# Patient Record
Sex: Female | Born: 1965 | Race: White | Hispanic: No | Smoking: Current every day smoker
Health system: Southern US, Community
[De-identification: ages and names within clinical notes are randomized; demographics above are authoritative.]

## PROBLEM LIST (undated history)

## (undated) DIAGNOSIS — I10 Essential (primary) hypertension: Secondary | ICD-10-CM

## (undated) DIAGNOSIS — E079 Disorder of thyroid, unspecified: Secondary | ICD-10-CM

---

## 2018-09-01 ENCOUNTER — Encounter: Payer: Self-pay | Admitting: Emergency Medicine

## 2018-09-01 ENCOUNTER — Emergency Department
Admission: EM | Admit: 2018-09-01 | Discharge: 2018-09-01 | Disposition: A | Discharge status: Home / Self Care | Payer: BLUE CROSS/BLUE SHIELD | Attending: Emergency Medicine | Admitting: Emergency Medicine

## 2018-09-01 DIAGNOSIS — R440 Auditory hallucinations: Secondary | ICD-10-CM

## 2018-09-01 DIAGNOSIS — I10 Essential (primary) hypertension: Secondary | ICD-10-CM | POA: Insufficient documentation

## 2018-09-01 DIAGNOSIS — F17210 Nicotine dependence, cigarettes, uncomplicated: Secondary | ICD-10-CM | POA: Diagnosis not present

## 2018-09-01 DIAGNOSIS — E079 Disorder of thyroid, unspecified: Secondary | ICD-10-CM | POA: Diagnosis not present

## 2018-09-01 DIAGNOSIS — F29 Unspecified psychosis not due to a substance or known physiological condition: Secondary | ICD-10-CM

## 2018-09-01 HISTORY — DX: Essential (primary) hypertension: I10

## 2018-09-01 HISTORY — DX: Disorder of thyroid, unspecified: E07.9

## 2018-09-01 LAB — COMPREHENSIVE METABOLIC PANEL
ALT: 34 U/L (ref 0–44)
AST: 36 U/L (ref 15–41)
Albumin: 4.2 g/dL (ref 3.5–5.0)
Alkaline Phosphatase: 121 U/L (ref 38–126)
Anion gap: 9 (ref 5–15)
BUN: 9 mg/dL (ref 6–20)
CO2: 27 mmol/L (ref 22–32)
CREATININE: 0.67 mg/dL (ref 0.44–1.00)
Calcium: 9.5 mg/dL (ref 8.9–10.3)
Chloride: 102 mmol/L (ref 98–111)
GFR calc Af Amer: >60 mL/min (ref >60)
GFR calc non Af Amer: >60 mL/min (ref >60)
Glucose, Bld: 93 mg/dL (ref 70–99)
Potassium: 3.7 mmol/L (ref 3.5–5.1)
Sodium: 138 mmol/L (ref 135–145)
Total Bilirubin: 0.6 mg/dL (ref 0.3–1.2)
Total Protein: 8.4 g/dL — ABNORMAL HIGH (ref 6.5–8.1)

## 2018-09-01 LAB — URINALYSIS, COMPLETE (UACMP) WITH MICROSCOPIC
Bilirubin Urine: NEGATIVE
Glucose, UA: NEGATIVE mg/dL
Ketones, ur: NEGATIVE mg/dL
Nitrite: NEGATIVE
Protein, ur: NEGATIVE mg/dL
SPECIFIC GRAVITY, URINE: 1.005 (ref 1.005–1.030)
pH: 8 (ref 5.0–8.0)

## 2018-09-01 LAB — URINE DRUG SCREEN, QUALITATIVE (ARMC ONLY)
Amphetamines, Ur Screen: NOT DETECTED
Barbiturates, Ur Screen: NOT DETECTED
Benzodiazepine, Ur Scrn: NOT DETECTED
COCAINE METABOLITE, UR ~~LOC~~: NOT DETECTED
Cannabinoid 50 Ng, Ur ~~LOC~~: NOT DETECTED
MDMA (Ecstasy)Ur Screen: NOT DETECTED
Methadone Scn, Ur: NOT DETECTED
Opiate, Ur Screen: NOT DETECTED
Phencyclidine (PCP) Ur S: NOT DETECTED
TRICYCLIC, UR SCREEN: NOT DETECTED

## 2018-09-01 LAB — CBC WITH DIFFERENTIAL/PLATELET
Abs Immature Granulocytes: 0.01 10*3/uL (ref 0.00–0.07)
BASOS PCT: 1 %
Basophils Absolute: 0.1 10*3/uL (ref 0.0–0.1)
Eosinophils Absolute: 0.1 10*3/uL (ref 0.0–0.5)
Eosinophils Relative: 1 %
HCT: 45.6 % (ref 36.0–46.0)
Hemoglobin: 15.2 g/dL — ABNORMAL HIGH (ref 12.0–15.0)
Immature Granulocytes: 0 %
Lymphocytes Relative: 27 %
Lymphs Abs: 2.1 10*3/uL (ref 0.7–4.0)
MCH: 31.1 pg (ref 26.0–34.0)
MCHC: 33.3 g/dL (ref 30.0–36.0)
MCV: 93.4 fL (ref 80.0–100.0)
Monocytes Absolute: 0.5 10*3/uL (ref 0.1–1.0)
Monocytes Relative: 6 %
Neutro Abs: 5.2 10*3/uL (ref 1.7–7.7)
Neutrophils Relative %: 65 %
PLATELETS: 387 10*3/uL (ref 150–400)
RBC: 4.88 MIL/uL (ref 3.87–5.11)
RDW: 11.8 % (ref 11.5–15.5)
WBC: 8 10*3/uL (ref 4.0–10.5)
nRBC: 0 % (ref 0.0–0.2)

## 2018-09-01 LAB — TSH: TSH: 4.87 u[IU]/mL — ABNORMAL HIGH (ref 0.350–4.500)

## 2018-09-01 LAB — ETHANOL: Alcohol, Ethyl (B): <10 mg/dL (ref <10)

## 2018-09-01 LAB — ACETAMINOPHEN LEVEL: Acetaminophen (Tylenol), Serum: <10 ug/mL — ABNORMAL LOW (ref 10–30)

## 2018-09-01 LAB — SALICYLATE LEVEL

## 2018-09-01 MED ORDER — OLANZAPINE 10 MG PO TABS: 10.0000 mg | ORAL_TABLET | Freq: Every day | ORAL | 1 refills | Status: AC

## 2018-09-01 NOTE — ED Provider Notes (Signed)
-----------------------------------------   3:51 PM on 09/01/2018 -----------------------------------------  I took over care of this patient from Dr. Alphonzo Lemmings.  The patient has been evaluated by Dr. Toni Amend.  He advises that the patient currently does not meet commitment criteria, but he agrees that she is having auditory hallucinations and symptoms of possible psychosis.  He recommends starting the patient on Zyprexa and has given her a prescription for this.  She states that this time that she agrees to take it.  The patient states that she is able to follow-up with a mental health professional through her work and agreed to do this.  At this time the patient is stable for discharge home.  I counseled her on the results of the work-up and gave her thorough return precautions.  She expressed understanding.   Dionne Bucy, MD 09/01/18 1555

## 2018-09-01 NOTE — ED Notes (Signed)
Denies SI/HI

## 2018-09-01 NOTE — ED Notes (Signed)
ED Provider at bedside. 

## 2018-09-01 NOTE — ED Notes (Signed)
Dr. Clapacs at bedside.  Maintained on 15 minute checks and observation by security camera for safety. 

## 2018-09-01 NOTE — ED Provider Notes (Addendum)
[facility] Emergency Department Provider Note  ____________________________________________   I have reviewed the triage vital signs and the nursing notes. Where available I have reviewed prior notes and, if possible and indicated, outside hospital notes.    HISTORY  Chief Complaint Hearing Problem    HPI Nicole Berry is a 53 y.o. female who has a history of thyroidism hypertension, states that she has been hearing voices since October 21.  The voices are speaking in [place].  They are not command hallucinations.  She states that it is annoying to hear them talking all the time.  She does not just hear them in 1 year she hears them in both.  She states that she has heard about how people can transmit voices using hearing aids and she is certain that no one is implanted anything in her however she wants someone to look in her ears and make sure there is no other pathology present.  Patient states she has not been sleeping very well and she feels very stressed because of these voices.  She states she did hear voices once before when she was younger but does not seem to want to give any details about that.  She denies any SI or HI, she states she is not being abused but she is going through a separation which is causing her a great deal of stress.  She states she is sleeping poorly but she is sleeping.  She does not feel this is a psychiatric problem she feels that these voices are real and that no one else can hear them.  Sometimes is the voice of people she knows who will do not happen to be present.  Any other complaint, she states she does not drink or do drugs, he does make it to work every day and she has been waiting till now to get help because she has not had insurance benefits with her job.  Nothing makes the symptoms better, nothing makes it better no other alleviating or aggravating factors, no prior treatment.  It is concerning to her.   Past Medical History:   Diagnosis Date  . Hypertension   . Thyroid disease     There are no active problems to display for this patient.   History reviewed. No pertinent surgical history.  Prior to Admission medications   Not on File    Allergies Patient has no known allergies.  No family history on file.  Social History Social History   Tobacco Use  . Smoking status: Current Every Day Smoker    Packs/day: 2.00    Types: Cigarettes  . Smokeless tobacco: Never Used  Substance Use Topics  . Alcohol use: Not Currently  . Drug use: Not on file    Review of Systems constitutional: No fever/chills Eyes: No visual changes. ENT: No sore throat. No stiff neck no neck pain Cardiovascular: Denies chest pain. Respiratory: Denies shortness of breath. Gastrointestinal:   no vomiting.  No diarrhea.  No constipation. Genitourinary: Negative for dysuria. Musculoskeletal: Negative lower extremity swelling Skin: Negative for rash. Neurological: Negative for severe headaches, focal weakness or numbness.   ____________________________________________   PHYSICAL EXAM:  VITAL SIGNS: ED Triage Vitals  Enc Vitals Group     BP 09/01/18 1341 (!) 158/90     Pulse Rate 09/01/18 1341 91     Resp 09/01/18 1341 18     Temp 09/01/18 1341 97.7 F (36.5 C)     Temp Source 09/01/18 1341 Oral  SpO2 09/01/18 1341 100 %     Weight 09/01/18 1342 140 lb (63.5 kg)     Height 09/01/18 1342 5\' 8"  (1.727 m)     Head Circumference --      Peak Flow --      Pain Score 09/01/18 1341 0     Pain Loc --      Pain Edu? --      Excl. in GC? --     Constitutional: Alert and oriented. Well appearing and in no acute distress.  Patient seems very stressed and anxious but in no acute medical distress Eyes: Conjunctivae are normal Head: Atraumatic HEENT: No congestion/rhinnorhea. Mucous membranes are moist.  Oropharynx non-erythematous TMs are normal Neck:   Nontender with no meningismus, no masses, no  stridor Cardiovascular: Normal rate, regular rhythm. Grossly normal heart sounds.  Good peripheral circulation. Respiratory: Normal respiratory effort.  No retractions. Lungs CTAB. Abdominal: Soft and nontender. No distention. No guarding no rebound Back:  There is no focal tenderness or step off.  there is no midline tenderness there are no lesions noted. there is no CVA tenderness Musculoskeletal: No lower extremity tenderness, no upper extremity tenderness. No joint effusions, no DVT signs strong distal pulses no edema Neurologic:  Normal speech and language. No gross focal neurologic deficits are appreciated.  Skin:  Skin is warm, dry and intact. No rash noted. Psychiatric: Mood and affect are stressed, and again she is talking about hearing voices but otherwise does not seem to be psychotic.  ____________________________________________   LABS (all labs ordered are listed, but only abnormal results are displayed)  Labs Reviewed  CBC WITH DIFFERENTIAL/PLATELET - Abnormal; Notable for the following components:      Result Value   Hemoglobin 15.2 (*)    All other components within normal limits  COMPREHENSIVE METABOLIC PANEL  URINALYSIS, COMPLETE (UACMP) WITH MICROSCOPIC  URINE DRUG SCREEN, QUALITATIVE ([facility] ONLY)  ACETAMINOPHEN LEVEL  SALICYLATE LEVEL  ETHANOL    Pertinent labs  results that were available during my care of the patient were reviewed by me and considered in my medical decision making (see chart for details). ____________________________________________  EKG  I personally interpreted any EKGs ordered by me or triage  ____________________________________________  RADIOLOGY  Pertinent labs & imaging results that were available during my care of the patient were reviewed by me and considered in my medical decision making (see chart for details). If possible, patient and/or family made aware of any abnormal findings.  No results  found. ____________________________________________    PROCEDURES  Procedure(s) performed: None  Procedures  Critical Care performed: None  ____________________________________________   INITIAL IMPRESSION / ASSESSMENT AND PLAN / ED COURSE  Pertinent labs & imaging results that were available during my care of the patient were reviewed by me and considered in my medical decision making (see chart for details).  Patient here very eager to have me look in her ears to see where the voices are coming from.  I did look at her ears, there does not appear to be any evidence of external auditory pathology.  Patient does seem to be having a delusion of voices which is been present now for several months, 2-1/2 by her reckoning.  She very much does not want to be committed but she does agree to talk to a psychiatrist about this after I talked to her.  She does not have any SI or HI and seems to be functioning otherwise, I do not think this is sufficient  on its own to mandate IVC, and thus psychiatry disagrees.  For this reason I did talk to Dr. Toni Amend, who will come evaluate the patient very much appreciate the consult.  We are also checking basic blood work, patient agrees with this.  ----------------------------------------- 3:17 PM on 09/01/2018 ----------------------------------------- Signed out to Dr. Marisa Severin at the end of my shift.  Pending psychiatric consultation and blood work      ____________________________________________   FINAL CLINICAL IMPRESSION(S) / ED DIAGNOSES  Final diagnoses:  None      This chart was dictated using voice recognition software.  Despite best efforts to proofread,  errors can occur which can change meaning.     Jeanmarie Plant, MD 09/01/18 1440    Jeanmarie Plant, MD 09/01/18 [phone]

## 2018-09-01 NOTE — Consult Note (Signed)
[facility] Face-to-Face Psychiatry Consult   Reason for Consult: Consult for this 53 year old woman who came to the emergency room with complaints of auditory hallucinations Referring Physician: Alphonzo Lemmings Patient Identification: Nicole Berry MRN:  [id number] Principal Diagnosis: Psychosis (HCC) Diagnosis:  Principal Problem:   Psychosis (HCC)   Total Time spent with patient: 1 hour  Subjective:   Nicole Berry is a 53 y.o. female patient admitted with "I have been having this thing in my ear".  HPI: Patient seen chart reviewed.  Case discussed with emergency room doctor.  This is a 53 year old woman previously unknown to [place] who came to the emergency room today complaining that she can hear sounds and voices in her ears.  This has been going on since October but her insurance only became effective today which is why she came to the emergency room.  She says that it will sometimes sound like a buzzing but frequently sounds like women's voices.  They will say things to her such as "they do not want to hear".  Denies that they are making any commands or telling her to do anything dangerous.  She says this happens almost all day and all night and is keeping her up at night.  She has been sleeping poorly sometimes going 2 days without sleep.  Mood has also been more irritable and grumpy.  She lives with a man with whom she has been together for about 7 years and they are currently in the process of breaking up.  It was impossible to tell from talking to her whether this was caused by her mental state or was a cause of her mental state or how it was related.  Clearly though she is under some stress about it.  She mentions feeling like there are other stresses but will not discuss what they are.  She denies any drug or alcohol use.  Patient denies any suicidal thoughts or wish to die.  Denies any homicidal or violent thoughts.  Patient insisted to me that she did not think that what she is having could be a psychiatric  symptom.  She was somewhat difficult to interview frequently saying she thought she would get up and leave but ultimately did not stay long enough for a useful conversation.  Social history: Lives in [place].  Has been together with her partner for about 7 years.  She says she is getting ready to leave because they are "no longer compatible".  Do not know any other details about it.  She works at the [employer] hours.  Says that she likes her job.  Medical history: History of hypertension and thyroid disease denies any other significant ongoing medical problems  Substance abuse history: Denies any drug or alcohol abuse  Past Psychiatric History: Difficult to get information.  Patient told me that she had never seen a psychiatrist in the past "except against my will".  She ultimately admitted that she had had psychiatric hospitalizations in the past at least once in [place].  She would not give me any details about it.  She did appear to have some familiarity with psychiatric jargon which was on display during the interview.  Did not appear to be familiar with any medications that I mentioned but did seem to be familiar with the idea of psychiatric medicine.  Told me that she did not understand how she could go so long in between episodes if this was a psychiatric condition.  Still would not give me any details about  the prior "episode".  Risk to Self:   Risk to Others:   Prior Inpatient Therapy:   Prior Outpatient Therapy:    Past Medical History:  Past Medical History:  Diagnosis Date  . Hypertension   . Thyroid disease    History reviewed. No pertinent surgical history. Family History: No family history on file. Family Psychiatric  History: Does not know of any Social History:  Social History   Substance and Sexual Activity  Alcohol Use Not Currently     Social History   Substance and Sexual Activity  Drug Use Not on file    Social History   Socioeconomic History   . Marital status: Legally Separated    Spouse name: Not on file  . Number of children: Not on file  . Years of education: Not on file  . Highest education level: Not on file  Occupational History  . Not on file  Social Needs  . Financial resource strain: Not on file  . Food insecurity:    Worry: Not on file    Inability: Not on file  . Transportation needs:    Medical: Not on file    Non-medical: Not on file  Tobacco Use  . Smoking status: Current Every Day Smoker    Packs/day: 2.00    Types: Cigarettes  . Smokeless tobacco: Never Used  Substance and Sexual Activity  . Alcohol use: Not Currently  . Drug use: Not on file  . Sexual activity: Not on file  Lifestyle  . Physical activity:    Days per week: Not on file    Minutes per session: Not on file  . Stress: Not on file  Relationships  . Social connections:    Talks on phone: Not on file    Gets together: Not on file    Attends religious service: Not on file    Active member of club or organization: Not on file    Attends meetings of clubs or organizations: Not on file    Relationship status: Not on file  Other Topics Concern  . Not on file  Social History Narrative  . Not on file   Additional Social History:    Allergies:  No Known Allergies  Labs:  Results for orders placed or performed during the hospital encounter of 09/01/18 (from the past 48 hour(s))  CBC with Differential     Status: Abnormal   Collection Time: 09/01/18  2:18 PM  Result Value Ref Range   WBC 8.0 4.0 - 10.5 K/uL   RBC 4.88 3.87 - 5.11 MIL/uL   Hemoglobin 15.2 (H) 12.0 - 15.0 g/dL   HCT 57.845.6 46.936.0 - 62.946.0 %   MCV 93.4 80.0 - 100.0 fL   MCH 31.1 26.0 - 34.0 pg   MCHC 33.3 30.0 - 36.0 g/dL   RDW 52.811.8 41.311.5 - 24.415.5 %   Platelets 387 150 - 400 K/uL   nRBC 0.0 0.0 - 0.2 %   Neutrophils Relative % 65 %   Neutro Abs 5.2 1.7 - 7.7 K/uL   Lymphocytes Relative 27 %   Lymphs Abs 2.1 0.7 - 4.0 K/uL   Monocytes Relative 6 %   Monocytes  Absolute 0.5 0.1 - 1.0 K/uL   Eosinophils Relative 1 %   Eosinophils Absolute 0.1 0.0 - 0.5 K/uL   Basophils Relative 1 %   Basophils Absolute 0.1 0.0 - 0.1 K/uL   Immature Granulocytes 0 %   Abs Immature Granulocytes 0.01 0.00 - 0.07 K/uL  Comment: Performed at [facility], [address] [place], [place] [address]  Comprehensive metabolic panel     Status: Abnormal   Collection Time: 09/01/18  2:18 PM  Result Value Ref Range   Sodium 138 135 - 145 mmol/L   Potassium 3.7 3.5 - 5.1 mmol/L   Chloride 102 98 - 111 mmol/L   CO2 27 22 - 32 mmol/L   Glucose, Bld 93 70 - 99 mg/dL   BUN 9 6 - 20 mg/dL   Creatinine, Ser 0.71 0.44 - 1.00 mg/dL   Calcium 9.5 8.9 - 21.9 mg/dL   Total Protein 8.4 (H) 6.5 - 8.1 g/dL   Albumin 4.2 3.5 - 5.0 g/dL   AST 36 15 - 41 U/L   ALT 34 0 - 44 U/L   Alkaline Phosphatase 121 38 - 126 U/L   Total Bilirubin 0.6 0.3 - 1.2 mg/dL   GFR calc non Af Amer >60 >60 mL/min   GFR calc Af Amer >60 >60 mL/min   Anion gap 9 5 - 15    Comment: Performed at [facility], [address], [place], [place] [address]  Urinalysis, Complete w Microscopic     Status: Abnormal   Collection Time: 09/01/18  2:18 PM  Result Value Ref Range   Color, Urine YELLOW (A) YELLOW   APPearance CLEAR (A) CLEAR   Specific Gravity, Urine 1.005 1.005 - 1.030   pH 8.0 5.0 - 8.0   Glucose, UA NEGATIVE NEGATIVE mg/dL   Hgb urine dipstick SMALL (A) NEGATIVE   Bilirubin Urine NEGATIVE NEGATIVE   Ketones, ur NEGATIVE NEGATIVE mg/dL   Protein, ur NEGATIVE NEGATIVE mg/dL   Nitrite NEGATIVE NEGATIVE   Leukocytes, UA SMALL (A) NEGATIVE   RBC / HPF 0-5 0 - 5 RBC/hpf   WBC, UA 0-5 0 - 5 WBC/hpf   Bacteria, UA RARE (A) NONE SEEN   Squamous Epithelial / LPF 0-5 0 - 5    Comment: Performed at [facility], [address] [place] [address], [place], [place] [address]  Urine Drug Screen, Qualitative     Status: None   Collection Time: 09/01/18  2:18 PM  Result Value Ref Range    Tricyclic, Ur Screen NONE DETECTED NONE DETECTED   Amphetamines, Ur Screen NONE DETECTED NONE DETECTED   MDMA (Ecstasy)Ur Screen NONE DETECTED NONE DETECTED   Cocaine Metabolite,Ur [place] NONE DETECTED NONE DETECTED   Opiate, Ur Screen NONE DETECTED NONE DETECTED   Phencyclidine (PCP) Ur S NONE DETECTED NONE DETECTED   Cannabinoid 50 Ng, Ur [place] NONE DETECTED NONE DETECTED   Barbiturates, Ur Screen NONE DETECTED NONE DETECTED   Benzodiazepine, Ur Scrn NONE DETECTED NONE DETECTED   Methadone Scn, Ur NONE DETECTED NONE DETECTED    Comment: (NOTE) Tricyclics + metabolites, urine    Cutoff 1000 ng/mL Amphetamines + metabolites, urine  Cutoff 1000 ng/mL MDMA (Ecstasy), urine              Cutoff 500 ng/mL Cocaine Metabolite, urine          Cutoff 300 ng/mL Opiate + metabolites, urine        Cutoff 300 ng/mL Phencyclidine (PCP), urine         Cutoff 25 ng/mL Cannabinoid, urine                 Cutoff 50 ng/mL Barbiturates + metabolites, urine  Cutoff 200 ng/mL Benzodiazepine, urine              Cutoff 200 ng/mL  Methadone, urine                   Cutoff 300 ng/mL The urine drug screen provides only a preliminary, unconfirmed analytical test result and should not be used for non-medical purposes. Clinical consideration and professional judgment should be applied to any positive drug screen result due to possible interfering substances. A more specific alternate chemical method must be used in order to obtain a confirmed analytical result. Gas chromatography / mass spectrometry (GC/MS) is the preferred confirmat ory method. Performed at [facility], [address] [place], [place] [address]   Acetaminophen level     Status: Abnormal   Collection Time: 09/01/18  2:18 PM  Result Value Ref Range   Acetaminophen (Tylenol), Serum <10 (L) 10 - 30 ug/mL    Comment: (NOTE) Therapeutic concentrations vary significantly. A range of 10-30 ug/mL  may be an effective concentration for many  patients. However, some  are best treated at concentrations outside of this range. Acetaminophen concentrations >150 ug/mL at 4 hours after ingestion  and >50 ug/mL at 12 hours after ingestion are often associated with  toxic reactions. Performed at [facility], [address] [place], [place] [address]   Salicylate level     Status: None   Collection Time: 09/01/18  2:18 PM  Result Value Ref Range   Salicylate Lvl <7.0 2.8 - 30.0 mg/dL    Comment: Performed at [facility], [address] [place], [place] [address]  Ethanol     Status: None   Collection Time: 09/01/18  2:18 PM  Result Value Ref Range   Alcohol, Ethyl (B) <10 <10 mg/dL    Comment: (NOTE) Lowest detectable limit for serum alcohol is 10 mg/dL. For medical purposes only. Performed at [facility], [address] [place], [place] [address]   TSH     Status: Abnormal   Collection Time: 09/01/18  2:18 PM  Result Value Ref Range   TSH 4.870 (H) 0.350 - 4.500 uIU/mL    Comment: Performed by a 3rd Generation assay with a functional sensitivity of <=0.01 uIU/mL. Performed at [facility], [address] [place], [place] [address]     No current facility-administered medications for this encounter.    Current Outpatient Medications  Medication Sig Dispense Refill  . OLANZapine (ZYPREXA) 10 MG tablet Take 1 tablet (10 mg total) by mouth at bedtime. 30 tablet 1    Musculoskeletal: Strength & Muscle Tone: within normal limits Gait & Station: normal Patient leans: N/A  Psychiatric Specialty Exam: Physical Exam  Nursing note and vitals reviewed. Constitutional: She appears well-developed and well-nourished.  HENT:  Head: Normocephalic and atraumatic.  Eyes: Pupils are equal, round, and reactive to light. Conjunctivae are normal.  Neck: Normal range of motion.  Cardiovascular: Regular rhythm and normal heart sounds.  Respiratory: Effort normal.  GI: Soft.   Musculoskeletal: Normal range of motion.  Neurological: She is alert.  Skin: Skin is warm and dry.  Psychiatric: Her mood appears anxious. Her affect is labile and inappropriate. Her speech is tangential. She is agitated. She is not aggressive and not combative. Thought content is paranoid. Cognition and memory are normal. She expresses inappropriate judgment. She expresses no homicidal and no suicidal ideation.    Review of Systems  Constitutional: Negative.   HENT: Negative.   Eyes: Negative.   Respiratory: Negative.   Cardiovascular: Negative.   Gastrointestinal: Negative.   Musculoskeletal: Negative.   Skin: Negative.  Neurological: Negative.   Psychiatric/Behavioral: Positive for hallucinations. Negative for depression, memory loss, substance abuse and suicidal ideas. The patient is nervous/anxious and has insomnia.     Blood pressure (!) 152/102, pulse 95, temperature (!) 97.4 F (36.3 C), temperature source Oral, resp. rate 18, height 5\' 8"  (1.727 m), weight 63.5 kg, SpO2 100 %.Body mass index is 21.29 kg/m.  General Appearance: Casual  Eye Contact:  Minimal  Speech:  Clear and Coherent  Volume:  Normal  Mood:  Dysphoric  Affect:  Constricted  Thought Process:  Coherent  Orientation:  Full (Time, Place, and Person)  Thought Content:  Lucid but a little paranoid and rather evasive.  Suicidal Thoughts:  No  Homicidal Thoughts:  No  Memory:  Immediate;   Fair Recent;   Fair Remote;   Fair  Judgement:  Impaired  Insight:  Shallow  Psychomotor Activity:  Normal  Concentration:  Concentration: Fair  Recall:  [employer]  Fund of Knowledge:  Fair  Language:  Fair  Akathisia:  No  Handed:  Right  AIMS (if indicated):     Assets:  Housing Physical Health  ADL's:  Intact  Cognition:  WNL  Sleep:        Treatment Plan Summary: Medication management and Plan This is a patient who presents with auditory hallucinations irritability sleeplessness.  Based on the interview it  seems clear that she has had psychiatric treatment in the past.  Patient is unwilling to acknowledge directly that this could be a mental health problem.  There is no evidence that she is suicidal or violent dangerous or failing to take care of herself.  She does not meet commitment criteria.  I spent quite a bit of time trying to form some rapport and expressed some empathy for the patient in order to convince her that she should consider that this may be a mental health problem.  I told her that my guess is that she probably has a type of bipolar illness that can be quiesced sent for years and then resulted in psychotic breakdowns under stress.  I strongly advised patient to let us start medication.  I pointed out to her that these kind of things might possibly get better on their own but are at least as likely to get worse before they get better.  Patient ultimately agreed to take a prescription for olanzapine 10 mg at night.  Side effects reviewed.  Strongly encouraged her to use her new insurance to get in to see a mental health provider which she can apparently do through her work.  Advised her to come back to the emergency room anytime she was concerned about her symptoms particularly if things got worse.  Case reviewed with emergency room doctor.  Disposition: No evidence of imminent risk to self or others at present.   Patient does not meet criteria for psychiatric inpatient admission. Supportive therapy provided about ongoing stressors. Discussed crisis plan, support from social network, calling 911, coming to the Emergency Department, and calling Suicide Hotline.  Mordecai RasmussenJohn Jermain Curt, MD 09/01/2018 4:33 PM

## 2018-09-01 NOTE — ED Triage Notes (Addendum)
Patient presents to the ED for "hearing things" since October.  Patient states, "I think something is wrong with my ears."  Patient states, "I know I'm not crazy because I'm functional.  I wake up in the morning and drive my car and go to work no problem."  Patient reports difficulty sleeping.  Patient states, "it's like I'm hearing a radio, but if I say something, it talks back and sometimes it changes frequencies."  Patient denies any commands from voices.  Patient states she does not want to see a psychiatrist.

## 2018-09-01 NOTE — Discharge Instructions (Addendum)
Follow-up with a mental health provider through your work as discussed with the psychiatrist Dr. Toni Amend.  We have also provided you with a number for 1 of the primary care providers in our area for you to make an appointment with her primary care/family doctor.  Start taking the medication as prescribed.  Return to the ER immediately at any time if your symptoms worsen such as if you start hearing voices more frequently, you have other intrusive thoughts, or hearing or seeing things that are not there, have worsening anxiety, or any thoughts of wanting to hurt yourself or hurt anyone else.  TSH  01/01 1418  TSH 4.870      Urinalysis, Complete w Microscopic  01/01 1418  Color, Urine YELLOW  Appearance CLEAR  Specific Gravity, Urine 1.005  pH 8.0  Glucose, UA NEGATIVE  Hgb urine dipstick SMALL  Bilirubin Urine NEGATIVE  Ketones, ur NEGATIVE  Protein NEGATIVE  Nitrite NEGATIVE  Leukocytes, UA SMALL  RBC / HPF 0-5  WBC, UA 0-5  Bacteria, UA RARE  Squamous Epithelial / LPF 0-5                  Salicylate level  01/01 1418  Salicylate Lvl <7.0  Ethanol  01/01 1418  Alcohol, Ethyl (B) <10  CBC with Differential  01/01 1418  WBC 8.0  RBC 4.88  Hemoglobin 15.2  HCT 45.6  MCV 93.4  MCH 31.1  MCHC 33.3  RDW 11.8  Platelets 387  nRBC 0.0  Neutrophils 65  NEUT# 5.2  Lymphocytes 27  Lymphocyte # 2.1  Monocytes Relative 6  Monocyte # 0.5  Eosinophil 1  Eosinophils Absolute 0.1  Basophil 1  Basophils Absolute 0.1  Immature Granulocytes 0  Abs Immature Granulocytes 0.01      Acetaminophen level  01/01 1418  150 ug/mL at 4 hours after ingestion  and 50 ug/mL at 12 hours after ingestion are often associated with  toxic reactions. Performed at [facility], [address] class="_edCourseHoverClick" id="[email or web address]" data-isabnormal="true" Acetaminophen (Tylenol), S <10   150 ug/mL at 4 hours after ingestion  and 50 ug/mL at 12  hours after ingestion are often associated with  toxic reactions. Performed at [facility], [address] class="_edCourseHoverClick" id="[email or web address]_Row_0" data-isabnormal="true"    Comprehensive metabolic panel  01/01 1418  Sodium 138  Potassium 3.7  Chloride 102  CO2 27  Glucose 93  BUN 9  Creatinine 0.67  Calcium 9.5  Total Protein 8.4  Albumin 4.2  AST 36  ALT 34  Alkaline Phosphatase 121  Total Bilirubin 0.6  60 mL/min Ref: 60 mL/min" class="_edCourseHoverClick" id="[email or web address]" GFR, Est Non African American >60  60 mL/min Ref: 60 mL/min" class="_edCourseHoverClick" id="[email or web address]_15" GFR, Est African American >60  Anion gap 9      Urine Drug Screen, Qualitative  01/01 1418  Tricyclic, Ur Screen NONE DETECTED  Amphetamines, Ur Screen NONE DETECTED  MDMA (Ecstasy)Ur Screen NONE DETECTED  Cocaine Metabolite,Ur [place] NONE DETECTED  Opiate, Ur Screen NONE DETECTED  Phencyclidine (PCP) Ur S NONE DETECTED  Cannabinoid 50 Ng, Ur [place] NONE DETECTED  Barbiturates, Ur Screen NONE DETECTED  Benzodiazepine, Ur Scrn NONE DETECTED  Methadone Scn, Ur NONE DETECTED

## 2018-09-01 NOTE — ED Notes (Signed)
Pt discharged to lobby. VS stable. Discharge paperwork reviewed with patient. Patient signed hard copy of D/C paperwork. Prescription reviewed with patient by Dr. Toni Amend. Pt denies SI.

## 2020-11-17 ENCOUNTER — Encounter: Payer: Self-pay | Admitting: Emergency Medicine

## 2020-11-17 ENCOUNTER — Emergency Department: Payer: No Typology Code available for payment source

## 2020-11-17 ENCOUNTER — Emergency Department
Admission: EM | Admit: 2020-11-17 | Discharge: 2020-11-17 | Disposition: A | Discharge status: Home / Self Care | Payer: No Typology Code available for payment source | Attending: Emergency Medicine | Admitting: Emergency Medicine

## 2020-11-17 ENCOUNTER — Other Ambulatory Visit: Payer: Self-pay

## 2020-11-17 DIAGNOSIS — Y990 Civilian activity done for income or pay: Secondary | ICD-10-CM | POA: Diagnosis not present

## 2020-11-17 DIAGNOSIS — I10 Essential (primary) hypertension: Secondary | ICD-10-CM | POA: Insufficient documentation

## 2020-11-17 DIAGNOSIS — S3992XA Unspecified injury of lower back, initial encounter: Secondary | ICD-10-CM | POA: Diagnosis present

## 2020-11-17 DIAGNOSIS — F17210 Nicotine dependence, cigarettes, uncomplicated: Secondary | ICD-10-CM | POA: Insufficient documentation

## 2020-11-17 DIAGNOSIS — E079 Disorder of thyroid, unspecified: Secondary | ICD-10-CM | POA: Diagnosis not present

## 2020-11-17 DIAGNOSIS — W010XXA Fall on same level from slipping, tripping and stumbling without subsequent striking against object, initial encounter: Secondary | ICD-10-CM | POA: Insufficient documentation

## 2020-11-17 DIAGNOSIS — S32039A Unspecified fracture of third lumbar vertebra, initial encounter for closed fracture: Secondary | ICD-10-CM | POA: Insufficient documentation

## 2020-11-17 MED ORDER — ONDANSETRON 4 MG PO TBDP: 4.0000 mg | ORAL_TABLET | Freq: Three times a day (TID) | ORAL | 0 refills | Status: AC | PRN

## 2020-11-17 MED ORDER — HYDROCODONE-ACETAMINOPHEN 5-325 MG PO TABS: 2.0000 | ORAL_TABLET | Freq: Four times a day (QID) | ORAL | 0 refills | Status: AC | PRN

## 2020-11-17 MED ORDER — METHOCARBAMOL 500 MG PO TABS: 500.0000 mg | ORAL_TABLET | Freq: Three times a day (TID) | ORAL | 0 refills | Status: AC | PRN

## 2020-11-17 NOTE — ED Triage Notes (Signed)
Pt reports slipped and fell at work 2 days ago and still with back pain. Pt denies LOC. Pt works for [employer]

## 2020-11-17 NOTE — Discharge Instructions (Signed)
Please make follow-up appointment with Dr. Marcell Barlow if his office does not contact you within the next 2 to 3 days. You have been prescribed Norco and Robaxin for pain. Please do not drive with these medications in your system.

## 2020-11-17 NOTE — Progress Notes (Signed)
Orthopedic Tech Progress Note Patient Details:  Nicole Berry [birth year] [id number] Called in order to [facility] Patient ID: [name], female   DOB: 1965-11-12, 55 y.o.   MRN: [id number]   [name] 11/17/2020, 6:03 PM

## 2020-11-17 NOTE — ED Notes (Signed)
Secretary has called for the brace.

## 2020-11-17 NOTE — ED Provider Notes (Signed)
[facility]-EMERGENCY DEPARTMENT  ____________________________________________  Time seen: Approximately 4:47 PM  I have reviewed the triage vital signs and the nursing notes.   HISTORY  Chief Complaint Fall and Back Pain   Historian Patient     HPI Nicole Berry is a 55 y.o. female presents to the emergency department with low back pain after a fall that occurred 2 days ago.  Patient denies hitting her head or neck.  No numbness or tingling in the upper and lower extremities.  She reports that her pain is worse at work and that she needs a note saying how maybe she can be off.  No bowel or bladder incontinence or saddle anesthesia.    Past Medical History:  Diagnosis Date   Hypertension    Thyroid disease      Immunizations up to date:  Yes.     Past Medical History:  Diagnosis Date   Hypertension    Thyroid disease     Patient Active Problem List   Diagnosis Date Noted   Psychosis (HCC) 09/01/2018    History reviewed. No pertinent surgical history.  Prior to Admission medications   Medication Sig Start Date End Date Taking? Authorizing Provider  HYDROcodone-acetaminophen (NORCO) 5-325 MG tablet Take 2 tablets by mouth every 6 (six) hours as needed for up to 3 days for moderate pain. 11/17/20 11/20/20 Yes Pia Mau M, PA-C  methocarbamol (ROBAXIN) 500 MG tablet Take 1 tablet (500 mg total) by mouth every 8 (eight) hours as needed for up to 5 days. 11/17/20 11/22/20 Yes Pia Mau M, PA-C  ondansetron (ZOFRAN ODT) 4 MG disintegrating tablet Take 1 tablet (4 mg total) by mouth every 8 (eight) hours as needed for up to 5 days. 11/17/20 11/22/20 Yes Pia Mau M, PA-C  OLANZapine (ZYPREXA) 10 MG tablet Take 1 tablet (10 mg total) by mouth at bedtime. 09/01/18   Clapacs, Jackquline Denmark, MD    Allergies Codeine  No family history on file.  Social History Social History   Tobacco Use   Smoking status: Current Every Day Smoker    Packs/day: 2.00    Types:  Cigarettes   Smokeless tobacco: Never Used  Substance Use Topics   Alcohol use: Not Currently     Review of Systems  Constitutional: No fever/chills Eyes:  No discharge ENT: No upper respiratory complaints. Respiratory: no cough. No SOB/ use of accessory muscles to breath Gastrointestinal:   No nausea, no vomiting.  No diarrhea.  No constipation. Musculoskeletal: Patient has low back pain. Skin: Negative for rash, abrasions, lacerations, ecchymosis.    ____________________________________________   PHYSICAL EXAM:  VITAL SIGNS: ED Triage Vitals  Enc Vitals Group     BP 11/17/20 1443 (!) 148/97     Pulse Rate 11/17/20 1443 93     Resp 11/17/20 1443 18     Temp 11/17/20 1443 98.2 F (36.8 C)     Temp Source 11/17/20 1443 Oral     SpO2 11/17/20 1443 95 %     Weight 11/17/20 1435 139 lb 15.9 oz (63.5 kg)     Height 11/17/20 1435 5\' 8"  (1.727 m)     Head Circumference --      Peak Flow --      Pain Score 11/17/20 1435 6     Pain Loc --      Pain Edu? --      Excl. in GC? --      Constitutional: Alert and oriented. Well appearing and in no acute distress.  Eyes: Conjunctivae are normal. PERRL. EOMI. Head: Atraumatic. Cardiovascular: Normal rate, regular rhythm. Normal S1 and S2.  Good peripheral circulation. Respiratory: Normal respiratory effort without tachypnea or retractions. Lungs CTAB. Good air entry to the bases with no decreased or absent breath sounds Gastrointestinal: Bowel sounds x 4 quadrants. Soft and nontender to palpation. No guarding or rigidity. No distention. Musculoskeletal: Full range of motion to all extremities. No obvious deformities noted. Patient has some paraspinal muscle tenderness along the lumbar spine.  Neurologic:  Normal for age. No gross focal neurologic deficits are appreciated.  Skin:  Skin is warm, dry and intact. No rash noted. Psychiatric: Mood and affect are normal for age. Speech and behavior are normal.    ____________________________________________   LABS (all labs ordered are listed, but only abnormal results are displayed)  Labs Reviewed - No data to display ____________________________________________  EKG   ____________________________________________  RADIOLOGY Geraldo Pitter, personally viewed and evaluated these images (plain radiographs) as part of my medical decision making, as well as reviewing the written report by the radiologist.    DG Lumbar Spine 2-3 Views  Result Date: 11/17/2020 CLINICAL DATA:  Fall two days ago with lower back pain. EXAM: LUMBAR SPINE - 2-3 VIEW COMPARISON:  None. FINDINGS: The alignment is normal. There is an acute appearing compression fracture of the L3 vertebral body with less than 25% height loss. There is no significant retropulsion into the central canal. Mild degenerative disc and joint disease is seen in the lower lumbar spine. IMPRESSION: Acute appearing compression fracture of the L3 vertebral body with less than 25% height loss. Electronically Signed   By: Romona Curls M.D.   On: 11/17/2020 17:32    ____________________________________________    PROCEDURES  Procedure(s) performed:     Procedures     Medications - No data to display   ____________________________________________   INITIAL IMPRESSION / ASSESSMENT AND PLAN / ED COURSE  Pertinent labs & imaging results that were available during my care of the patient were reviewed by me and considered in my medical decision making (see chart for details).  Clinical Course as of 11/17/20 1958  Sat Nov 17, 2020  1738 DG Lumbar Spine 2-3 Views [JW]    Clinical Course User Index [JW] Pia Mau M, PA-C     Assessment and Plan: Low back pain:  55 year old female presents to the emergency department with low back pain after mechanical fall that occurred at work 2 days ago.  Patient was hypertensive at triage but vital signs were otherwise reassuring.  X-ray  of the lumbar spine showed a L3 compression fracture of the vertebral body with less than 25% height loss.  I reached out to neurosurgeon on-call, Dr. Marcell Barlow.  Dr. Marcell Barlow recommended LSO bracing and follow-up with him in the office.  Patient was discharged with Norco and Robaxin.  Patient was advised not to drive with these medications in her system.  Return precautions were given to return with new or worsening symptoms.  All patient questions were answered.    ____________________________________________  FINAL CLINICAL IMPRESSION(S) / ED DIAGNOSES  Final diagnoses:  Closed fracture of third lumbar vertebra, unspecified fracture morphology, initial encounter (HCC)      NEW MEDICATIONS STARTED DURING THIS VISIT:  ED Discharge Orders         Ordered    ondansetron (ZOFRAN ODT) 4 MG disintegrating tablet  Every 8 hours PRN        11/17/20 1951    methocarbamol (ROBAXIN)  500 MG tablet  Every 8 hours PRN        11/17/20 1951    HYDROcodone-acetaminophen (NORCO) 5-325 MG tablet  Every 6 hours PRN        11/17/20 1951              This chart was dictated using voice recognition software/Dragon. Despite best efforts to proofread, errors can occur which can change the meaning. Any change was purely unintentional.     Orvil Feil, PA-C 11/17/20 Babette Relic    Delton Prairie, MD 11/19/20 [phone]

## 2021-09-17 ENCOUNTER — Encounter: Payer: Self-pay | Admitting: Emergency Medicine

## 2021-09-17 ENCOUNTER — Emergency Department
Admission: EM | Admit: 2021-09-17 | Discharge: 2021-09-17 | Disposition: A | Discharge status: Home / Self Care | Payer: Self-pay | Attending: Emergency Medicine | Admitting: Emergency Medicine

## 2021-09-17 ENCOUNTER — Other Ambulatory Visit: Payer: Self-pay

## 2021-09-17 DIAGNOSIS — B349 Viral infection, unspecified: Secondary | ICD-10-CM | POA: Insufficient documentation

## 2021-09-17 DIAGNOSIS — Z20822 Contact with and (suspected) exposure to covid-19: Secondary | ICD-10-CM | POA: Insufficient documentation

## 2021-09-17 LAB — RESP PANEL BY RT-PCR (FLU A&B, COVID) ARPGX2
Influenza A by PCR: NEGATIVE
Influenza B by PCR: NEGATIVE
SARS Coronavirus 2 by RT PCR: NEGATIVE

## 2021-09-17 MED ORDER — KETOROLAC TROMETHAMINE 30 MG/ML IJ SOLN
15.0000 mg | Freq: Once | INTRAMUSCULAR | Status: AC
  Administered 2021-09-17: 15 mg via INTRAVENOUS
  Filled 2021-09-17: qty 1

## 2021-09-17 MED ORDER — SODIUM CHLORIDE 0.9 % IV BOLUS
1000.0000 mL | Freq: Once | INTRAVENOUS | Status: AC
  Administered 2021-09-17: 1000 mL via INTRAVENOUS

## 2021-09-17 MED ORDER — ONDANSETRON HCL 4 MG/2ML IJ SOLN
4.0000 mg | Freq: Once | INTRAMUSCULAR | Status: AC
  Administered 2021-09-17: 4 mg via INTRAVENOUS
  Filled 2021-09-17: qty 2

## 2021-09-17 NOTE — ED Triage Notes (Signed)
Pt to ED via POV with c/o headache, fever, bodyaches and chills since Friday.

## 2021-09-17 NOTE — ED Provider Notes (Signed)
[facility] Provider Note    Event Date/Time   First MD Initiated Contact with Patient 09/17/21 1312     (approximate)   History   Generalized Body Aches, Fever, and Headache   HPI  Nicole Berry is a 56 y.o. female presents to the emergency department for treatment and evaluation of body aches, headache, cough, congestion, sore throat, and earache.  She had some nausea.  Symptoms started 4 days ago.  No known sick exposures.     Physical Exam   Triage Vital Signs: ED Triage Vitals  Enc Vitals Group     BP 09/17/21 1310 123/78     Pulse Rate 09/17/21 1310 (!) 102     Resp 09/17/21 1310 18     Temp 09/17/21 1310 98.7 F (37.1 C)     Temp Source 09/17/21 1310 Oral     SpO2 09/17/21 1310 98 %     Weight 09/17/21 1309 139 lb 15.9 oz (63.5 kg)     Height 09/17/21 1309 5\' 8"  (1.727 m)     Head Circumference --      Peak Flow --      Pain Score 09/17/21 1309 5     Pain Loc --      Pain Edu? --      Excl. in GC? --     Most recent vital signs: Vitals:   09/17/21 1310 09/17/21 1558  BP: 123/78 125/73  Pulse: (!) 102 98  Resp: 18 18  Temp: 98.7 F (37.1 C) 98.9 F (37.2 C)  SpO2: 98% 98%     General: Awake, no distress.  CV:  Good peripheral perfusion.  Resp:  Normal effort.  Breath sounds clear to auscultation. Abd:  No distention.  Other:     ED Results / Procedures / Treatments   Labs (all labs ordered are listed, but only abnormal results are displayed) Labs Reviewed  RESP PANEL BY RT-PCR (FLU A&B, COVID) ARPGX2     EKG  Not indicated   RADIOLOGY Not indicated  PROCEDURES:  Critical Care performed: No  Procedures   MEDICATIONS ORDERED IN ED: Medications  sodium chloride 0.9 % bolus 1,000 mL (0 mLs Intravenous Stopped 09/17/21 1558)  ketorolac (TORADOL) 30 MG/ML injection 15 mg (15 mg Intravenous Given 09/17/21 1458)  ondansetron (ZOFRAN) injection 4 mg (4 mg Intravenous Given 09/17/21 1458)     IMPRESSION /  MDM / ASSESSMENT AND PLAN / ED COURSE  I reviewed the triage vital signs and the nursing notes.                              Differential diagnosis includes, but is not limited to, COVID, influenza, viral syndrome  56 year old female presenting to the emergency department for symptoms as described in the HPI.  Is overall reassuring.  COVID and influenza testing is negative.  She is slightly tachycardic.  1 L of IV fluids given while here resolution.  Symptomatic treatment discussed.  She will be discharged home work excuse for the next few days.  She was encouraged to follow-up with primary care or return to the emergency department for symptoms that change or worsen.    FINAL CLINICAL IMPRESSION(S) / ED DIAGNOSES   Final diagnoses:  Viral syndrome     Rx / DC Orders   ED Discharge Orders     None        Note:  This document was prepared  using Conservation officer, historic buildings and may include unintentional dictation errors.   Chinita Pester, FNP 09/17/21 1715    Shaune Pollack, MD 09/19/21 1943

## 2021-09-17 NOTE — ED Notes (Signed)
Pt c/o flu-like sx's for 3 days (HA, chills, body aches, nausea). Pt states she feels warm at times, but has not taken temp with a thermometer; no vomiting. Decreased appetite.

## 2021-09-17 NOTE — Discharge Instructions (Signed)
Rotate Tylenol and ibuprofen every 4 hours.  Follow-up with your primary care provider for symptoms that are not improving over the next few days.  Return to the emergency department for symptoms of change or worsen if unable to schedule an appointment.

## 2022-07-14 IMAGING — CR DG LUMBAR SPINE 2-3V
3 series · 3 of 3 positions shown · non-contrast
Comparison: None.

CLINICAL DATA: Fall two days ago with lower back pain.

EXAM:
LUMBAR SPINE - 2-3 VIEW

[l-spine ap]
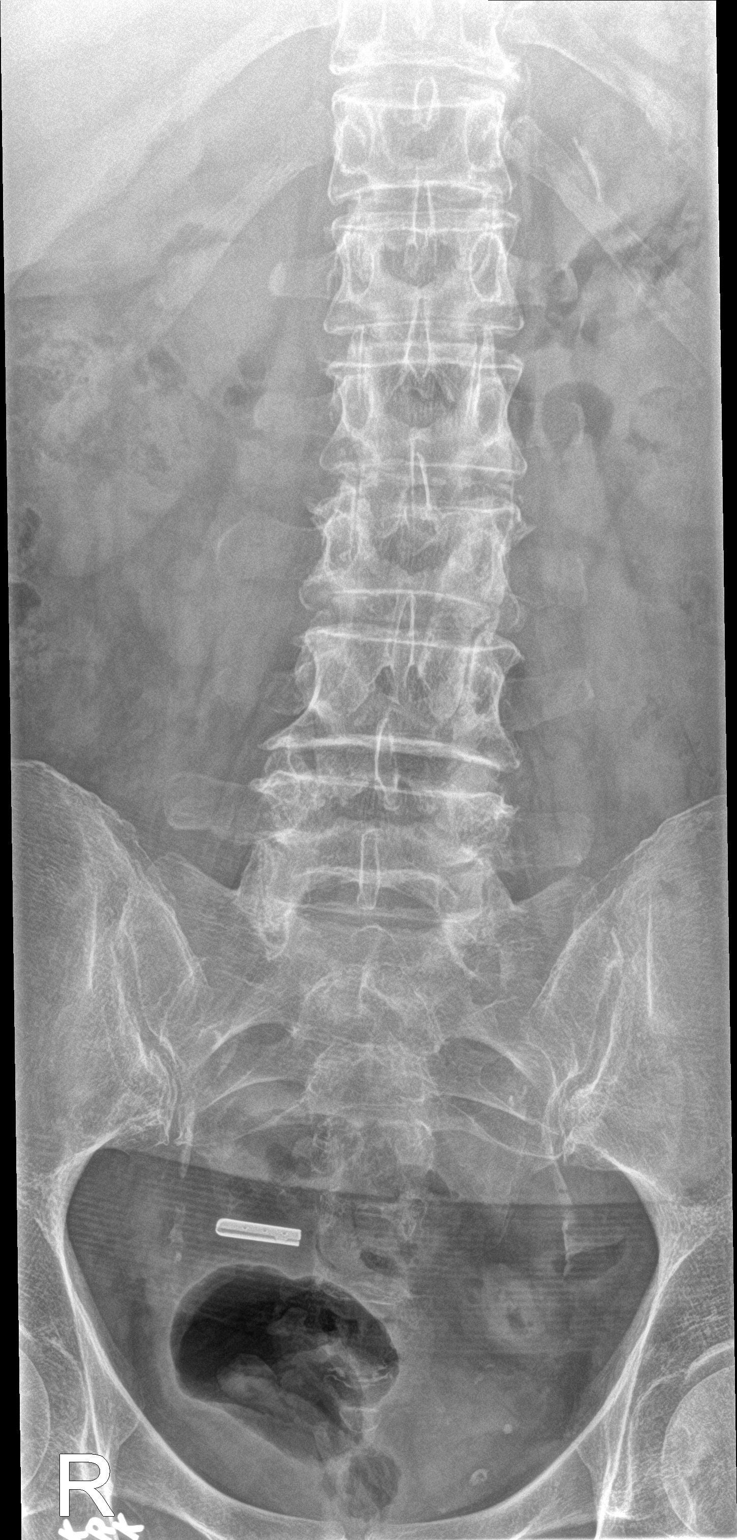

[l-spine lat]
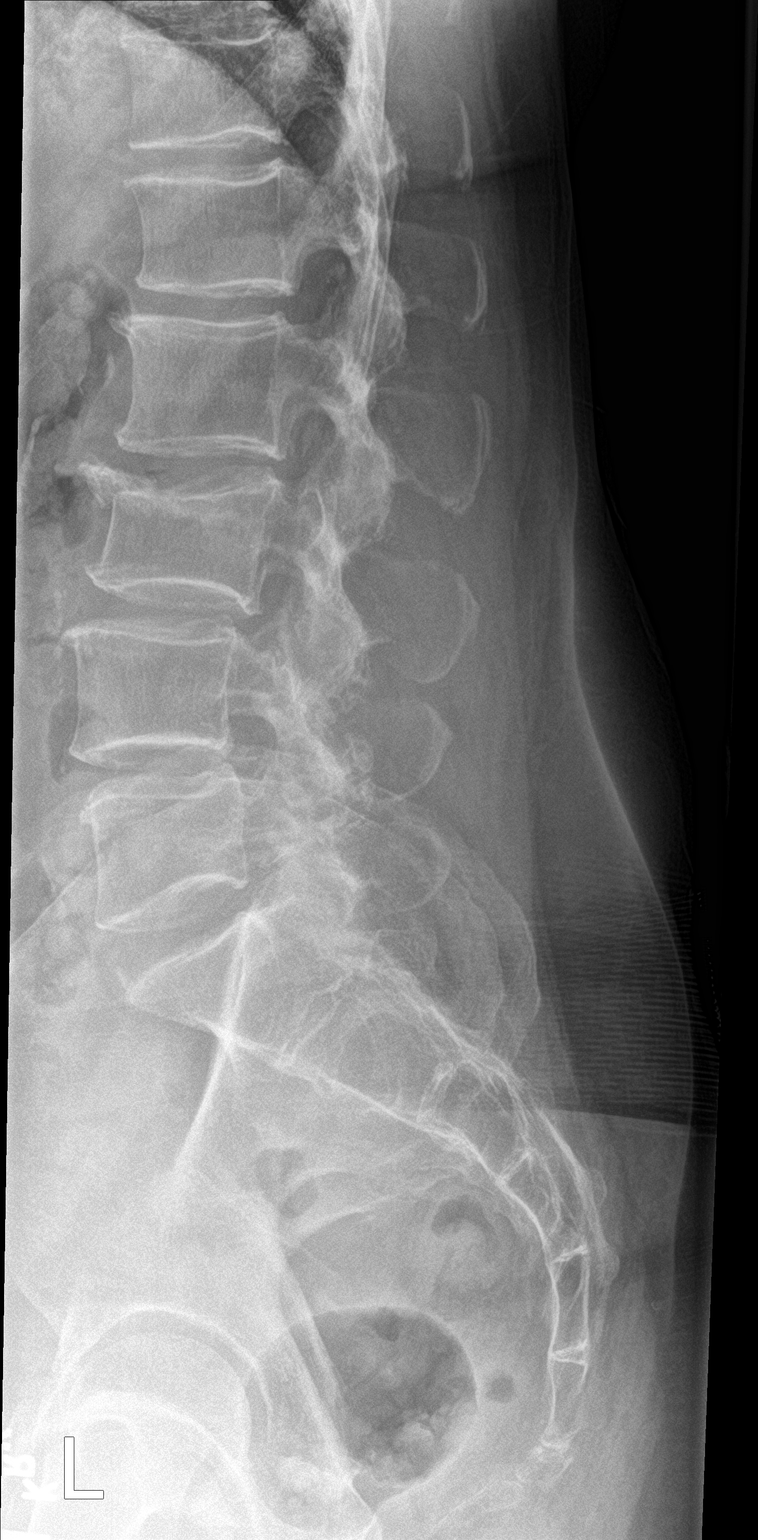

[l-spine spot]
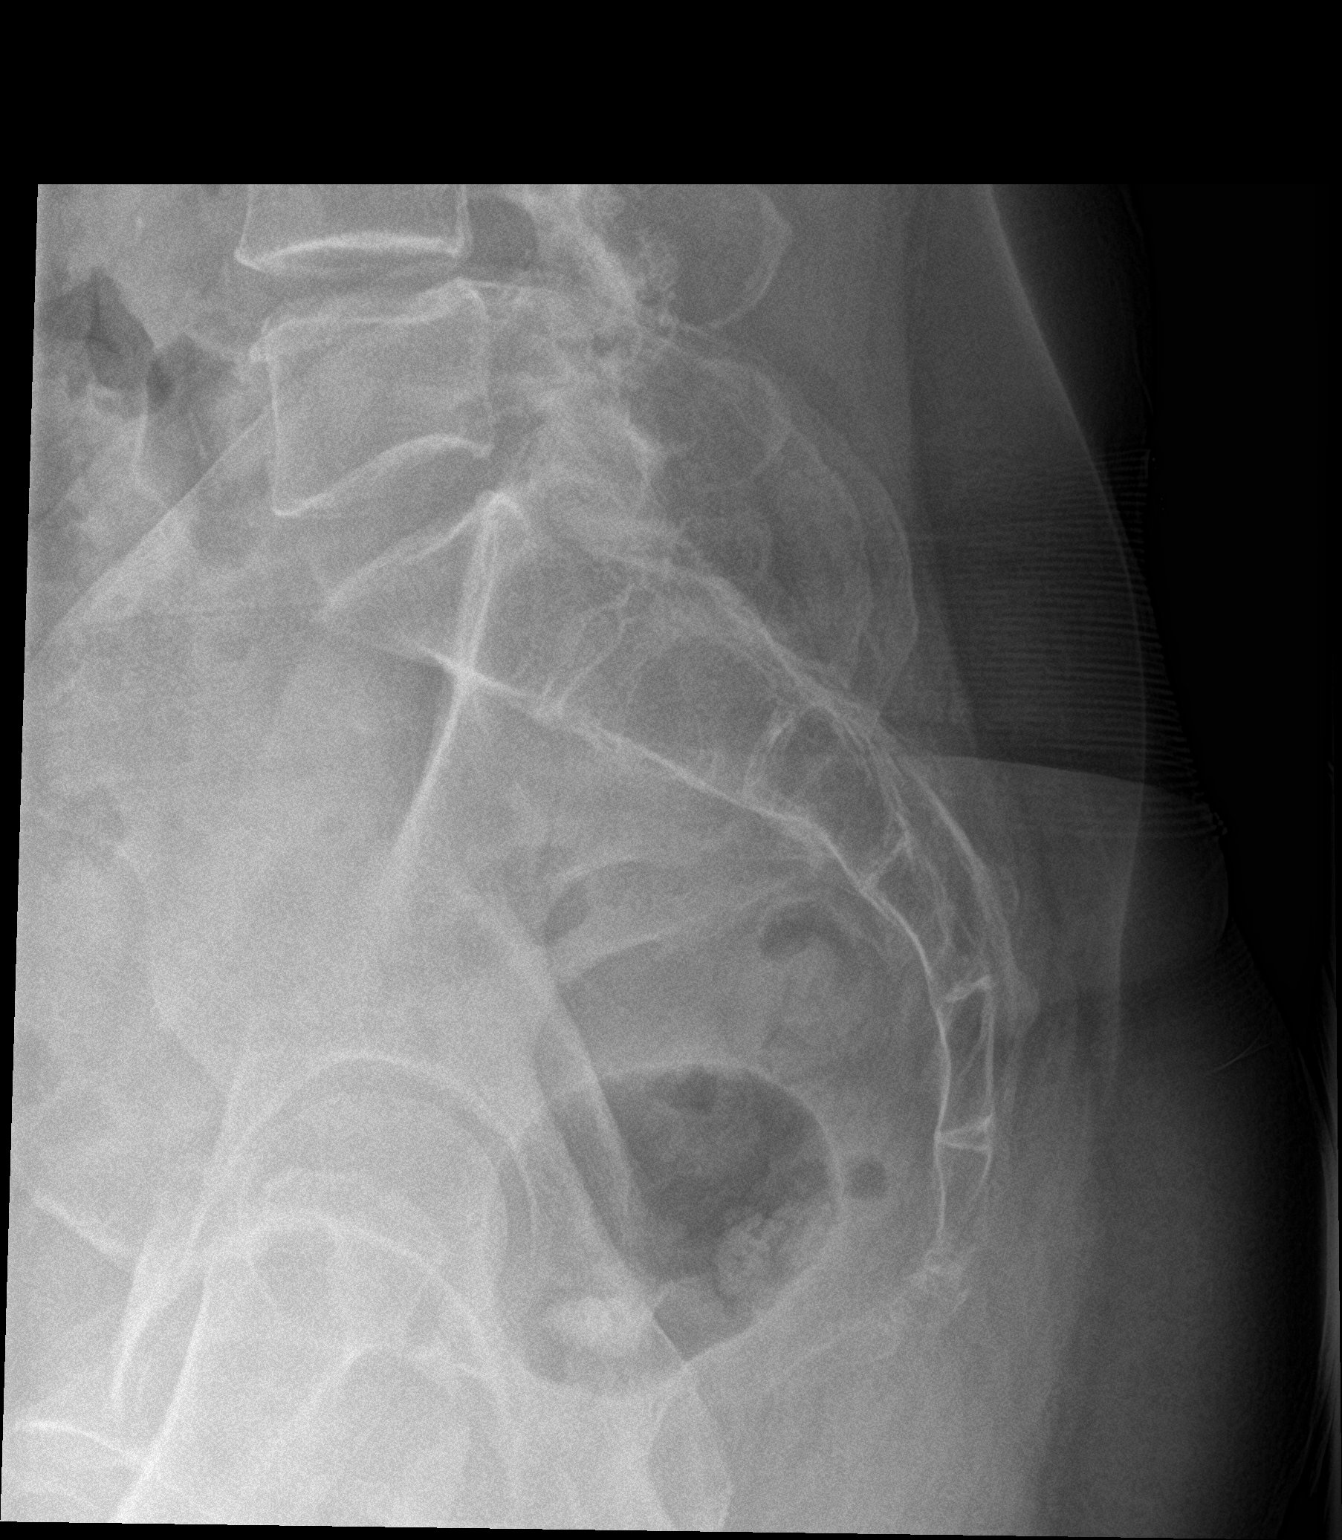

[3 of 3 positions shown; findings below may reference images not displayed]

FINDINGS: The alignment is normal. There is an acute appearing compression
fracture of the L3 vertebral body with less than 25% height loss.
There is no significant retropulsion into the central canal. Mild
degenerative disc and joint disease is seen in the lower lumbar
spine.
IMPRESSION: Acute appearing compression fracture of the L3 vertebral body with
less than 25% height loss.

## 2024-09-28 ENCOUNTER — Emergency Department: Payer: Worker's Compensation

## 2024-09-28 ENCOUNTER — Emergency Department
Admission: EM | Admit: 2024-09-28 | Discharge: 2024-09-28 | Disposition: A | Discharge status: Home / Self Care | Payer: Worker's Compensation

## 2024-09-28 ENCOUNTER — Other Ambulatory Visit: Payer: Self-pay

## 2024-09-28 DIAGNOSIS — S6992XA Unspecified injury of left wrist, hand and finger(s), initial encounter: Secondary | ICD-10-CM | POA: Diagnosis present

## 2024-09-28 DIAGNOSIS — X501XXA Overexertion from prolonged static or awkward postures, initial encounter: Secondary | ICD-10-CM | POA: Insufficient documentation

## 2024-09-28 DIAGNOSIS — Y990 Civilian activity done for income or pay: Secondary | ICD-10-CM | POA: Diagnosis not present

## 2024-09-28 DIAGNOSIS — S63502A Unspecified sprain of left wrist, initial encounter: Secondary | ICD-10-CM | POA: Insufficient documentation

## 2024-09-28 NOTE — ED Notes (Signed)
 Pt ANOx4 and stated she does not need a drug or alcohol screen for workers comp.

## 2024-09-28 NOTE — Discharge Instructions (Signed)
 Your x-ray reveals normal-appearing hardware, and no bony problems.  You may use the wrist brace for extra support while you are using your hand.  Please return for any new, worsening, or changing symptoms or other concerns.  You may follow-up with hand surgery if your symptoms persist.  It was a pleasure caring for you today.

## 2024-09-28 NOTE — ED Triage Notes (Signed)
 Pt to ED for left wrist pain. Reports surgery to left wrist 7 months ago. Reports pop yesterday in left wrist while scrapping ice off car

## 2024-09-28 NOTE — ED Provider Notes (Signed)
 "  [facility] Provider Note    Event Date/Time   First MD Initiated Contact with Patient 09/28/24 1406     (approximate)   History   Wrist Pain   HPI  Nicole Berry is a 59 y.o. female who presents today for evaluation of left wrist pain.  Patient reports that she had surgery in this wrist approximately 7 months ago and was cleaning ice off of her windshield and felt a pop in her left wrist while doing so.  She wants to be sure that her hardware and bones are okay.  She denies numbness or tingling.  She reports that her pain is resolved.  She has not noticed any swelling or skin changes.  Patient Active Problem List   Diagnosis Date Noted   Psychosis (HCC) 09/01/2018          Physical Exam   Triage Vital Signs: ED Triage Vitals  Encounter Vitals Group     BP 09/28/24 1257 (!) 183/98     Girls Systolic BP Percentile --      Girls Diastolic BP Percentile --      Boys Systolic BP Percentile --      Boys Diastolic BP Percentile --      Pulse Rate 09/28/24 1257 89     Resp 09/28/24 1257 16     Temp 09/28/24 1257 98.2 F (36.8 C)     Temp src --      SpO2 09/28/24 1257 95 %     Weight 09/28/24 1255 150 lb (68 kg)     Height 09/28/24 1255 5' 8 (1.727 m)     Head Circumference --      Peak Flow --      Pain Score 09/28/24 1255 1     Pain Loc --      Pain Education --      Exclude from Growth Chart --     Most recent vital signs: Vitals:   09/28/24 1257  BP: (!) 183/98  Pulse: 89  Resp: 16  Temp: 98.2 F (36.8 C)  SpO2: 95%    Physical Exam Vitals and nursing note reviewed.  Constitutional:      General: Awake and alert. No acute distress.    Appearance: Normal appearance. The patient is normal weight.  HENT:     Head: Normocephalic and atraumatic.     Mouth: Mucous membranes are moist.  Eyes:     General: PERRL. Normal EOMs        Right eye: No discharge.        Left eye: No discharge.     Conjunctiva/sclera: Conjunctivae  normal.  Cardiovascular:     Rate and Rhythm: Normal rate and regular rhythm.     Pulses: Normal pulses.  Pulmonary:     Effort: Pulmonary effort is normal. No respiratory distress.     Breath sounds: Normal breath sounds.  Abdominal:     Abdomen is soft. There is no abdominal tenderness. No rebound or guarding. No distention. Musculoskeletal:        General: No swelling. Normal range of motion.     Cervical back: Normal range of motion and neck supple.  Left wrist: Well-healed surgical scar to the volar aspect of wrist.  Patient has no reproducible bony tenderness.  There is no swelling or erythema to her wrist.  She has normal strength and sensation throughout her hand and wrist.  She has normal flexion and extension of wrist against resistance.  She has normal intrinsic muscle function of her hand.  No tenderness to forearm, elbow, humerus, or shoulder.  Normal radial pulse. Skin:    General: Skin is warm and dry.     Capillary Refill: Capillary refill takes less than 2 seconds.     Findings: No rash.  Neurological:     Mental Status: The patient is awake and alert.      ED Results / Procedures / Treatments   Labs (all labs ordered are listed, but only abnormal results are displayed) Labs Reviewed - No data to display   EKG     RADIOLOGY I independently reviewed and interpreted imaging and agree with radiologists findings.     PROCEDURES:  Critical Care performed:   Procedures   MEDICATIONS ORDERED IN ED: Medications - No data to display   IMPRESSION / MDM / ASSESSMENT AND PLAN / ED COURSE  I reviewed the triage vital signs and the nursing notes.   Differential diagnosis includes, but is not limited to, sprain, fracture, hardware problem.  Patient is awake and alert, hemodynamically stable afebrile.  She is nontoxic in appearance.  Normal radial pulse, normal intrinsic muscle function of her hand, normal and full range of motion of her wrist against  resistance.  X-ray obtained is negative for any acute bony or hardware abnormality.  Patient is reassured by these findings.  She was given a wrist splint for extra support and instructed to follow-up with her orthopedic surgeon.  She is requesting  the information for our hand surgeon instead, this was provided for her.  We discussed return precautions in the meantime.  She was discharged in stable condition.    Patient's presentation is most consistent with acute complicated illness / injury requiring diagnostic workup.    FINAL CLINICAL IMPRESSION(S) / ED DIAGNOSES   Final diagnoses:  Sprain of left wrist, unspecified location, initial encounter     Rx / DC Orders   ED Discharge Orders     None        Note:  This document was prepared using Dragon voice recognition software and may include unintentional dictation errors.   Daijah Scrivens E, PA-C 09/28/24 1455    Fernand Rossie HERO, MD 09/28/24 1545  "
